# Patient Record
Sex: Male | Born: 1975 | Race: Black or African American | Hispanic: No | Marital: Married | Smoking: Current every day smoker
Health system: Southern US, Community
[De-identification: ages and names within clinical notes are randomized; demographics above are authoritative.]

## PROBLEM LIST (undated history)

## (undated) DIAGNOSIS — K219 Gastro-esophageal reflux disease without esophagitis: Secondary | ICD-10-CM

## (undated) DIAGNOSIS — J45909 Unspecified asthma, uncomplicated: Secondary | ICD-10-CM

## (undated) HISTORY — PX: NO PAST SURGERIES: SHX2092

---

## 2002-04-12 ENCOUNTER — Encounter: Payer: Self-pay | Admitting: *Deleted

## 2002-04-12 ENCOUNTER — Emergency Department (HOSPITAL_COMMUNITY): Admission: EM | Admit: 2002-04-12 | Discharge: 2002-04-12 | Payer: Self-pay | Admitting: *Deleted

## 2002-10-23 ENCOUNTER — Ambulatory Visit (HOSPITAL_COMMUNITY): Admission: RE | Admit: 2002-10-23 | Discharge: 2002-10-23 | Payer: Self-pay | Admitting: Preventative Medicine

## 2002-10-23 ENCOUNTER — Encounter: Payer: Self-pay | Admitting: Preventative Medicine

## 2003-08-13 ENCOUNTER — Emergency Department (HOSPITAL_COMMUNITY): Admission: EM | Admit: 2003-08-13 | Discharge: 2003-08-13 | Payer: Self-pay | Admitting: Emergency Medicine

## 2003-12-30 ENCOUNTER — Emergency Department (HOSPITAL_COMMUNITY): Admission: EM | Admit: 2003-12-30 | Discharge: 2003-12-31 | Payer: Self-pay | Admitting: Emergency Medicine

## 2003-12-31 ENCOUNTER — Emergency Department (HOSPITAL_COMMUNITY): Admission: EM | Admit: 2003-12-31 | Discharge: 2003-12-31 | Payer: Self-pay | Admitting: Emergency Medicine

## 2004-10-08 ENCOUNTER — Emergency Department (HOSPITAL_COMMUNITY): Admission: EM | Admit: 2004-10-08 | Discharge: 2004-10-08 | Payer: Self-pay | Admitting: Emergency Medicine

## 2005-12-05 ENCOUNTER — Emergency Department (HOSPITAL_COMMUNITY): Admission: EM | Admit: 2005-12-05 | Discharge: 2005-12-05 | Payer: Self-pay | Admitting: Emergency Medicine

## 2006-09-28 ENCOUNTER — Emergency Department (HOSPITAL_COMMUNITY): Admission: EM | Admit: 2006-09-28 | Discharge: 2006-09-28 | Payer: Self-pay | Admitting: Emergency Medicine

## 2008-04-15 ENCOUNTER — Emergency Department (HOSPITAL_COMMUNITY): Admission: EM | Admit: 2008-04-15 | Discharge: 2008-04-15 | Payer: Self-pay | Admitting: Emergency Medicine

## 2008-09-02 IMAGING — CR DG CHEST 2V
2 series · 2 of 2 positions shown · non-contrast
Comparison: None.

CLINICAL DATA: Back pain, right-sided chest pain. 
 CHEST - 2 VIEW:

[w chest pa]
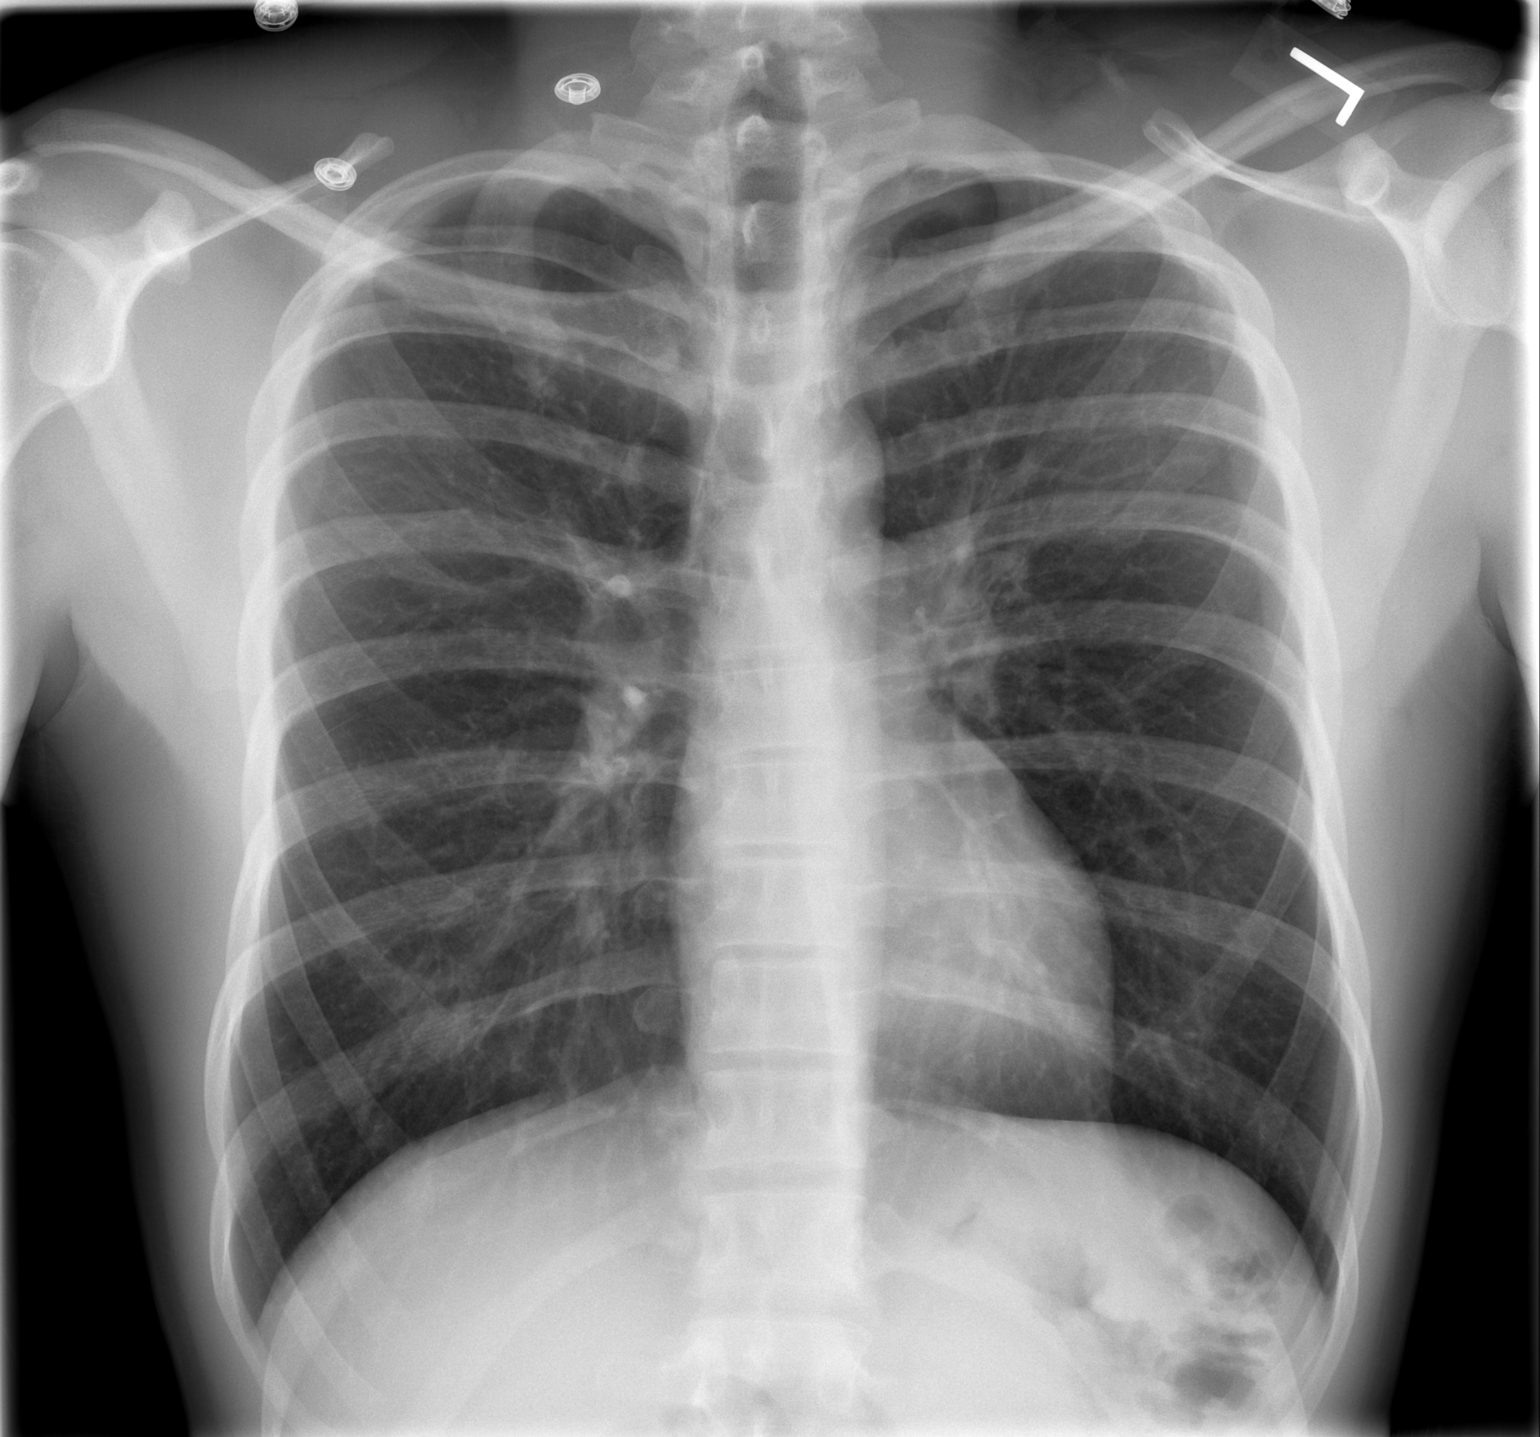

[w chest lat]
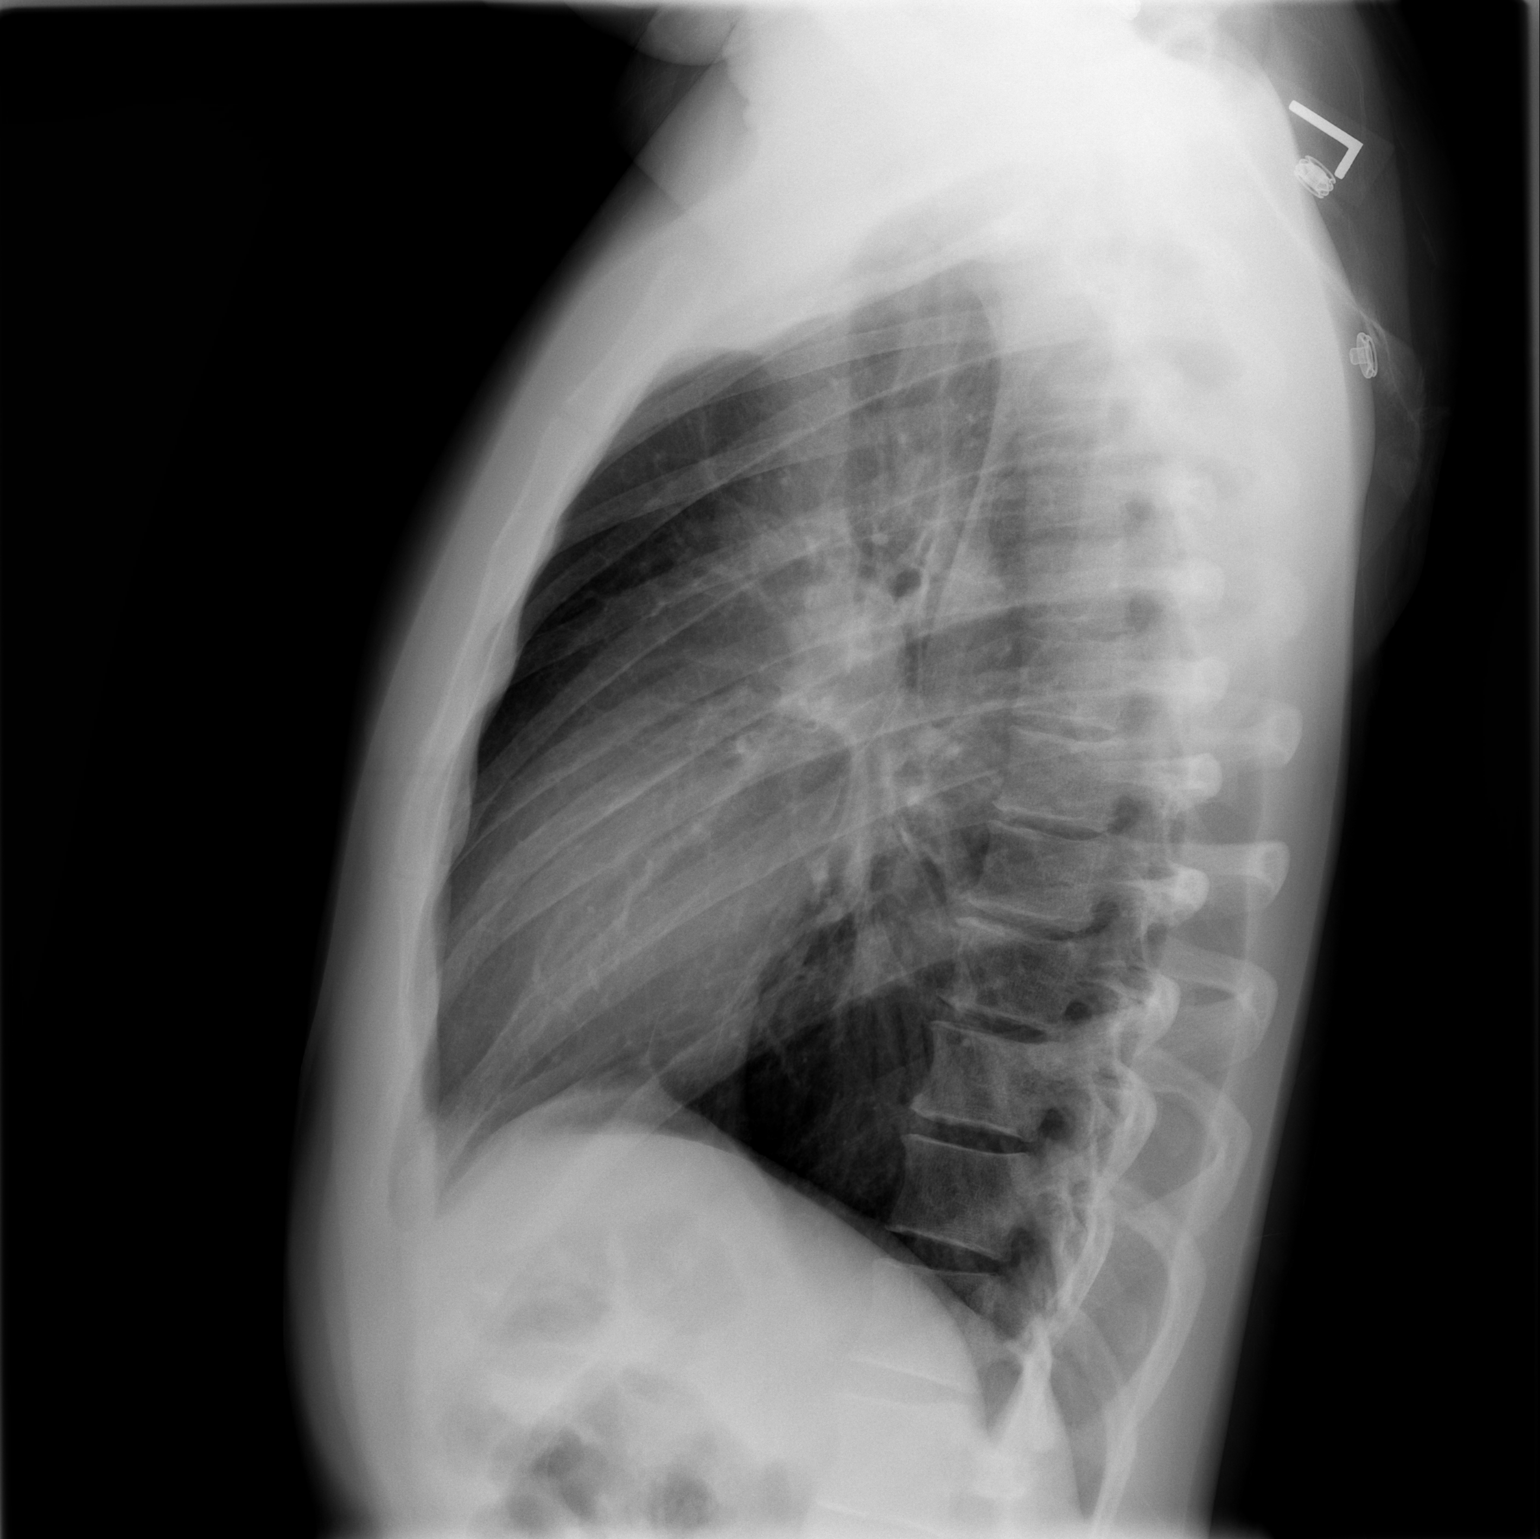

[2 of 2 positions shown; findings below may reference images not displayed]

FINDINGS: Trachea is midline.  Heart size normal.  Lungs are clear.  No pleural fluid.
IMPRESSION: No acute findings.

## 2010-07-27 LAB — RPR: RPR Ser Ql: NONREACTIVE

## 2021-08-05 ENCOUNTER — Emergency Department (HOSPITAL_BASED_OUTPATIENT_CLINIC_OR_DEPARTMENT_OTHER): Payer: 59

## 2021-08-05 ENCOUNTER — Encounter (HOSPITAL_COMMUNITY): Payer: Self-pay

## 2021-08-05 ENCOUNTER — Emergency Department (HOSPITAL_COMMUNITY)
Admission: EM | Admit: 2021-08-05 | Discharge: 2021-08-05 | Disposition: A | Discharge status: Home / Self Care | Payer: 59 | Attending: Emergency Medicine | Admitting: Emergency Medicine

## 2021-08-05 ENCOUNTER — Other Ambulatory Visit: Payer: Self-pay

## 2021-08-05 DIAGNOSIS — M79605 Pain in left leg: Secondary | ICD-10-CM | POA: Diagnosis present

## 2021-08-05 DIAGNOSIS — R0602 Shortness of breath: Secondary | ICD-10-CM | POA: Diagnosis not present

## 2021-08-05 LAB — D-DIMER, QUANTITATIVE: D-Dimer, Quant: <0.27 ug/mL-FEU (ref 0.00–0.50)

## 2021-08-05 MED ORDER — NAPROXEN 500 MG PO TABS: 500.0000 mg | ORAL_TABLET | Freq: Two times a day (BID) | ORAL | 0 refills | Status: DC | PRN

## 2021-08-05 NOTE — ED Triage Notes (Signed)
Patient complains of left leg pain that started a couple days ago with mild swelling.  ?

## 2021-08-05 NOTE — ED Notes (Signed)
Discharge instructions reviewed with patient. Patient verbalized understanding of instructions. Follow-up care and medications were reviewed. Patient ambulatory with steady gait. VSS upon discharge.  ?

## 2021-08-05 NOTE — Discharge Instructions (Signed)
Your testing is negative for blood clot.  Take the anti-inflammatories as prescribed and follow-up with your doctor.  Return to the ED with new or worsening symptoms ?

## 2021-08-05 NOTE — ED Provider Triage Note (Signed)
Emergency Medicine Provider Triage Evaluation Note ? ?Ryan Ruiz , a 46 y.o. male  was evaluated in triage.  Pt complains of pain and swelling to popliteal area.  Symptoms started last Thursday.  It has been intermittent since then.  Patient endorses swelling to popliteal area.  Denies any recent falls or injuries.  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 ? ?Denies any numbness, weakness, color change, pallor, wound, fever, chills. ? ?Review of Systems  ?Positive: Swelling and pain to palp to the area ?Negative: See above ? ?Physical Exam  ?BP (!) 144/95 (BP Location: Right Arm)   Pulse 90   Temp 99.2 ?F (37.3 ?C) (Oral)   Resp 16   SpO2 99%  ?Gen:   Awake, no distress   ?Resp:  Normal effort  ?MSK:   Moves extremities without difficulty  ?Other:  Minimal swelling to left popliteal fossa.  No swelling or tenderness to bilateral right lower extremities.  +2 left DP pulse.  Sensation intact to all digits of left foot. ? ?Medical Decision Making  ?Medically screening exam initiated at 2:32 PM.  Appropriate orders placed.  JAEKWON MCCLUNE was informed that the remainder of the evaluation will be completed by another provider, this initial triage assessment does not replace that evaluation, and the importance of remaining in the ED until their evaluation is complete. ? ?Concern for DVT versus Baker's cyst.  Will obtain ultrasound imaging for further evaluation. ?  ?Haskel Schroeder, PA-C ?08/05/21 1434 ? ?

## 2021-08-05 NOTE — ED Provider Notes (Signed)
?[facility] ?Provider Note ? ? ?CSN: [id number] ?Arrival date & time: 08/05/21  1402 ? ?  ? ?History ? ?Chief Complaint  ?Patient presents with  ? Leg Pain  ? ? ?Ryan Ruiz is a 46 y.o. male. ? ?Patient complains of left lower leg pain for the past 6 days.  Denies any specific injury the pain comes and goes improving with ibuprofen.  He climbs up and down a ladder but denies any fall or specific injury.  No cuts or scrapes.  No history of DVT.  Denies any other muscle pain or injury.  Does have some increased shortness of breath which he attributes to the weather change.  No chest pain.  No cough or fever. ? ?The history is provided by the patient.  ?Leg Pain ?Associated symptoms: no fever   ? ?  ? ?Home Medications ?Prior to Admission medications   ?Not on File  ?   ? ?Allergies    ?Patient has no known allergies.   ? ?Review of Systems   ?Review of Systems  ?Constitutional:  Negative for activity change, appetite change and fever.  ?HENT:  Negative for congestion.   ?Respiratory:  Negative for chest tightness and shortness of breath.   ?Cardiovascular:  Negative for chest pain.  ?Gastrointestinal:  Negative for abdominal pain, nausea and vomiting.  ?Genitourinary:  Negative for dysuria.  ?Musculoskeletal:  Positive for arthralgias and myalgias.  ?Skin:  Negative for rash.  ?Neurological:  Negative for seizures, weakness and numbness.  ? all other systems are negative except as noted in the HPI and PMH.  ? ?Physical Exam ?Updated Vital Signs ?BP (!) 146/90   Pulse 88   Temp 99.2 ?F (37.3 ?C) (Oral)   Resp 16   Ht 5\' 11"  (1.803 m)   Wt 78.5 kg   SpO2 99%   BMI 24.13 kg/m?  ?Physical Exam ?Vitals and nursing note reviewed.  ?Constitutional:   ?   General: He is not in acute distress. ?   Appearance: He is well-developed.  ?HENT:  ?   Head: Normocephalic and atraumatic.  ?   Mouth/Throat:  ?   Pharynx: No oropharyngeal exudate.  ?Eyes:  ?   Conjunctiva/sclera:  Conjunctivae normal.  ?   Pupils: Pupils are equal, round, and reactive to light.  ?Neck:  ?   Comments: No meningismus. ?Cardiovascular:  ?   Rate and Rhythm: Normal rate and regular rhythm.  ?   Heart sounds: Normal heart sounds. No murmur heard. ?Pulmonary:  ?   Effort: Pulmonary effort is normal. No respiratory distress.  ?   Breath sounds: Normal breath sounds.  ?Abdominal:  ?   Palpations: Abdomen is soft.  ?   Tenderness: There is no abdominal tenderness. There is no guarding or rebound.  ?Musculoskeletal:     ?   General: Tenderness present. Normal range of motion.  ?   Cervical back: Normal range of motion and neck supple.  ?   Comments: Overlying skin is normal.  There is minimal tenderness to palpation of the left superior calf.  No overlying erythema, cords streaks or redness.  Compartments soft. ? ?Intact DP and PT pulses  ?Skin: ?   General: Skin is warm.  ?Neurological:  ?   Mental Status: He is alert and oriented to person, place, and time.  ?   Cranial Nerves: No cranial nerve deficit.  ?   Motor: No abnormal muscle tone.  ?   Coordination:  Coordination normal.  ?   Comments:  5/5 strength throughout. CN 2-12 intact.Equal grip strength.   ?Psychiatric:     ?   Behavior: Behavior normal.  ? ? ?ED Results / Procedures / Treatments   ?Labs ?(all labs ordered are listed, but only abnormal results are displayed) ?Labs Reviewed  ?D-DIMER, QUANTITATIVE  ?BASIC METABOLIC PANEL  ? ? ?EKG ?None ? ?Radiology ?VAS [place] LOWER EXTREMITY VENOUS (DVT) (ONLY [facility] & [facility]) ? ?Result Date: 08/05/2021 ? Lower Venous DVT Study [name]  Date of Exam:   08/05/2021 Medical Rec #: [id number]           Accession #:    [id number] Date of Birth: [birth year]           Patient Gender: M Patient Age:   48 years Exam Location:  [facility] Procedure:      VAS [place] LOWER EXTREMITY VENOUS (DVT) Referring Phys: Debbe Mounts --------------------------------------------------------------------------------   Indications: Pain.  Risk Factors: None identified. Comparison Study: No prior studies. Performing Technologist: Oliver Hum RVT  Examination Guidelines: A complete evaluation includes B-mode imaging, spectral Doppler, color Doppler, and power Doppler as needed of all accessible portions of each vessel. Bilateral testing is considered an integral part of a complete examination. Limited examinations for reoccurring indications may be performed as noted. The reflux portion of the exam is performed with the patient in reverse Trendelenburg.  +-----+---------------+---------+-----------+----------+--------------+ RIGHTCompressibilityPhasicitySpontaneityPropertiesThrombus Aging +-----+---------------+---------+-----------+----------+--------------+ CFV  Full           Yes      Yes                                 +-----+---------------+---------+-----------+----------+--------------+   +---------+---------------+---------+-----------+----------+--------------+ LEFT     CompressibilityPhasicitySpontaneityPropertiesThrombus Aging +---------+---------------+---------+-----------+----------+--------------+ CFV      Full           Yes      Yes                                 +---------+---------------+---------+-----------+----------+--------------+ SFJ      Full                                                        +---------+---------------+---------+-----------+----------+--------------+ FV Prox  Full                                                        +---------+---------------+---------+-----------+----------+--------------+ FV Mid   Full                                                        +---------+---------------+---------+-----------+----------+--------------+ FV DistalFull                                                        +---------+---------------+---------+-----------+----------+--------------+  PFV      Full                                                         +---------+---------------+---------+-----------+----------+--------------+ POP      Full           Yes      Yes                                 +---------+---------------+---------+-----------+----------+--------------+ PTV      Full                                                        +---------+---------------+---------+-----------+----------+--------------+ PERO     Full                                                        +---------+---------------+---------+-----------+----------+--------------+     Summary: RIGHT: - No evidence of common femoral vein obstruction.  LEFT: - There is no evidence of deep vein thrombosis in the lower extremity.  - No cystic structure found in the popliteal fossa.  *See table(s) above for measurements and observations. Electronically signed by Harold Barban MD on 08/05/2021 at 8:08:49 PM.    Final    ? ?Procedures ?Procedures  ? ? ?Medications Ordered in ED ?Medications - No data to display ? ?ED Course/ Medical Decision Making/ A&P ?  ?                        ?Medical Decision Making ?Amount and/or Complexity of Data Reviewed ?Labs: ordered. ? ?Risk ?Prescription drug management. ? ?6 days of atraumatic leg pain.  Neurovascular intact. ? ?Ultrasound obtained in triage is negative for DVT or other acute pathology.  No evidence of acute limb ischemia. ?Given patient's reported shortness of breath will obtain screening D-dimer. ? ?Initial basic metabolic panel was not drawn with D-dimer.  Patient declines to wait.  Low suspicion for significant electrolyte abnormality. ?D-dimer is negative.  Low suspicion for DVT or pulmonary embolism.  Increase hydration at home.  Alternate Tylenol and ibuprofen as needed for aches.  Follow-up with PCP.  Return precautions are discussed ? ? ? ? ? ? ? ?Final Clinical Impression(s) / ED Diagnoses ?Final diagnoses:  ?Left leg pain  ? ? ?Rx / DC Orders ?ED Discharge Orders   ? ? None  ? ?  ? ? ?  ?Ezequiel Essex, MD ?08/05/21 2205 ? ?

## 2021-08-05 NOTE — Progress Notes (Signed)
Left lower extremity venous duplex has been completed. ?Preliminary results can be found in CV Proc through chart review.  ?Results were given to [facility]. ? ?08/05/21 3:49 PM ?Olen Cordial RVT   ?

## 2022-12-07 ENCOUNTER — Encounter (INDEPENDENT_AMBULATORY_CARE_PROVIDER_SITE_OTHER): Payer: Self-pay | Admitting: *Deleted

## 2023-01-31 ENCOUNTER — Encounter (INDEPENDENT_AMBULATORY_CARE_PROVIDER_SITE_OTHER): Payer: Self-pay | Admitting: *Deleted

## 2023-02-03 ENCOUNTER — Ambulatory Visit (INDEPENDENT_AMBULATORY_CARE_PROVIDER_SITE_OTHER): Payer: 59 | Admitting: Gastroenterology

## 2023-02-03 ENCOUNTER — Encounter (INDEPENDENT_AMBULATORY_CARE_PROVIDER_SITE_OTHER): Payer: Self-pay | Admitting: Gastroenterology

## 2023-02-03 VITALS — BP 125/91 | HR 66 | Temp 97.5°F | Ht 71.0 in | Wt 168.1 lb

## 2023-02-03 DIAGNOSIS — K219 Gastro-esophageal reflux disease without esophagitis: Secondary | ICD-10-CM

## 2023-02-03 DIAGNOSIS — R1319 Other dysphagia: Secondary | ICD-10-CM

## 2023-02-03 DIAGNOSIS — R1310 Dysphagia, unspecified: Secondary | ICD-10-CM

## 2023-02-03 NOTE — Progress Notes (Signed)
Katrinka Blazing, M.D. Gastroenterology & Hepatology [facility] Gastroenter[facility] [address] [place], [place] [address] Primary Care Physician: Samuella Bruin [address] [place] [address]  Referring MD: PCP  Chief Complaint: Dysphagia  History of Present Illness: MELVIN ALM is a 47 y.o. male with no PMH, who presents for evaluation of dysphagia.  Patient reports that in April 2024 he presented an episode of dysphagia to solid food. This has recurred a total of 2 more times.  States that when he ate chicken or steak, he felt the food got stuck in the middle of the chest. One time, he felt the food was not going down despite swallowing a lot of water, but it eventually passed to his stomach. He  states he has not vomited the food when it "got stuck in his chest". States he is currently undergoing dental work for dental disease and had some teeth removed. Believes his symptoms may be related to eating too fast.  Reports he has had a mild episode of heartburn every week. He does not take any prescription medications, but only Rolaids or Tums.  He is trying to avoid eating just before going to sleep.  The patient denies having any nausea, vomiting, fever, chills, hematochezia, melena, hematemesis, abdominal distention, abdominal pain, diarrhea, jaundice, pruritus or weight loss.  Last UJW:JXBJY Last Colonoscopy:never  FHx: neg for any gastrointestinal/liver disease, no malignancies Social: smokes 1pack every 2-3 days, neg alcohol. Smokes 1 marijuana joint every 2-3 days Surgical: no abdominal surgeries  Past Medical History:History reviewed. No pertinent past medical history.  Past Surgical History:History reviewed. No pertinent surgical history.  Family History:History reviewed. No pertinent family history.  Social History: Social History   Tobacco Use  Smoking Status Every Day   Types: Cigarettes  Smokeless Tobacco Never    Social History   Substance and Sexual Activity  Alcohol Use Never   Social History   Substance and Sexual Activity  Drug Use Yes   Types: Marijuana    Allergies: No Known Allergies  Medications: No current outpatient medications on file.   No current facility-administered medications for this visit.    Review of Systems: GENERAL: negative for malaise, night sweats HEENT: No changes in hearing or vision, no nose bleeds or other nasal problems. NECK: Negative for lumps, goiter, pain and significant neck swelling RESPIRATORY: Negative for cough, wheezing CARDIOVASCULAR: Negative for chest pain, leg swelling, palpitations, orthopnea GI: SEE HPI MUSCULOSKELETAL: Negative for joint pain or swelling, back pain, and muscle pain. SKIN: Negative for lesions, rash PSYCH: Negative for sleep disturbance, mood disorder and recent psychosocial stressors. HEMATOLOGY Negative for prolonged bleeding, bruising easily, and swollen nodes. ENDOCRINE: Negative for cold or heat intolerance, polyuria, polydipsia and goiter. NEURO: negative for tremor, gait imbalance, syncope and seizures. The remainder of the review of systems is noncontributory.   Physical Exam: BP (!) 125/91 (BP Location: Left Arm, Patient Position: Sitting, Cuff Size: Large)   Pulse 66   Temp (!) 97.5 F (36.4 C) (Temporal)   Ht 5\' 11"  (1.803 m)   Wt 168 lb 1.6 oz (76.2 kg)   BMI 23.45 kg/m  GENERAL: The patient is AO x3, in no acute distress. HEENT: Head is normocephalic and atraumatic. EOMI are intact. Mouth is well hydrated and without lesions. NECK: Supple. No masses LUNGS: Clear to auscultation. No presence of rhonchi/wheezing/rales. Adequate chest expansion HEART: RRR, normal s1 and s2. ABDOMEN: Soft, nontender, no guarding, no peritoneal signs, and  nondistended. BS +. No masses. EXTREMITIES: Without any cyanosis, clubbing, rash, lesions or edema. NEUROLOGIC: AOx3, no focal motor deficit. SKIN: no jaundice,  no rashes   Imaging/Labs: as above  I personally reviewed and interpreted the available labs, imaging and endoscopic files.  Impression and Plan: ARTAN VOYTKO is a 47 y.o. male with no PMH, who presents for evaluation of dysphagia.  Patient has presented a few isolated episode of dysphagia without other associated red flag signs.  He endorses having some intermittent heartburn which raises the concern for concomitant GERD.  Dysphagia could be related to strictures due to uncontrolled GERD, other possibilities include esophagitis or malignancy.  We will need to evaluate this further with an EGD.  He will be started on omeprazole 20 g every day to control his GERD symptoms.  He is due for colorectal cancer screening, we will schedule him for a colonoscopy on the same day of his esophagogastroduodenospy.  -Schedule EGD and colonoscopy -Start omeprazole 20 mg qday  All questions were answered.      Katrinka Blazing, MD Gastroenterology and Hepatology [facility] Gastroenterology

## 2023-02-03 NOTE — Patient Instructions (Signed)
Schedule EGD and colonoscopy Start omeprazole 20 mg qday

## 2023-02-04 ENCOUNTER — Telehealth (INDEPENDENT_AMBULATORY_CARE_PROVIDER_SITE_OTHER): Payer: Self-pay | Admitting: *Deleted

## 2023-02-04 ENCOUNTER — Other Ambulatory Visit (INDEPENDENT_AMBULATORY_CARE_PROVIDER_SITE_OTHER): Payer: Self-pay | Admitting: Gastroenterology

## 2023-02-04 DIAGNOSIS — R1319 Other dysphagia: Secondary | ICD-10-CM

## 2023-02-04 MED ORDER — OMEPRAZOLE 20 MG PO CPDR: 20.0000 mg | DELAYED_RELEASE_CAPSULE | Freq: Every day | ORAL | 3 refills | Status: DC

## 2023-02-04 NOTE — Telephone Encounter (Signed)
Pt seen yesterday and called because rx was not sent in to [facility]. I let him know it may be because omeprazole 20mg  is otc but he wanted to see if he could get as an rx and see if it is cheaper that way.   [facility]   [phone]

## 2023-02-04 NOTE — Telephone Encounter (Signed)
Medication sent to pharmacy  

## 2023-02-07 MED ORDER — PEG 3350-KCL-NA BICARB-NACL 420 G PO SOLR: 4000.0000 mL | Freq: Once | ORAL | 0 refills | Status: AC

## 2023-02-07 NOTE — Telephone Encounter (Signed)
Pt notified on vm that rx he was needing was sent to [facility] and to call back if any questions.

## 2023-02-07 NOTE — Addendum Note (Signed)
Addended by: Marlowe Shores on: 02/07/2023 12:16 PM   Modules accepted: Orders

## 2023-02-17 ENCOUNTER — Telehealth (INDEPENDENT_AMBULATORY_CARE_PROVIDER_SITE_OTHER): Payer: Self-pay | Admitting: *Deleted

## 2023-02-17 NOTE — Telephone Encounter (Signed)
Patient called in to R/S procedure for 11/20 d/t work. He has been moved to 11/27 at 10am. Aware will send new instructions via mychart and I have sent message to endo making aware.   Also checked [facility] website for PA: Notification/Precertification Requirement This member's plan does not currently require notification or prior-authorization through the [employer] Notification or Prior-Authorization Program. Please contact a Customer Care Professional at [phone] if you believe the information returned to be in error.

## 2023-03-09 ENCOUNTER — Encounter (INDEPENDENT_AMBULATORY_CARE_PROVIDER_SITE_OTHER): Payer: Self-pay | Admitting: *Deleted

## 2023-03-09 ENCOUNTER — Ambulatory Visit (HOSPITAL_COMMUNITY)
Admission: RE | Admit: 2023-03-09 | Discharge: 2023-03-09 | Disposition: A | Discharge status: Home / Self Care | Payer: 59 | Attending: Gastroenterology | Admitting: Gastroenterology

## 2023-03-09 ENCOUNTER — Other Ambulatory Visit: Payer: Self-pay

## 2023-03-09 ENCOUNTER — Ambulatory Visit (HOSPITAL_COMMUNITY): Payer: 59 | Admitting: Certified Registered"

## 2023-03-09 ENCOUNTER — Encounter (HOSPITAL_COMMUNITY): Payer: Self-pay | Admitting: Gastroenterology

## 2023-03-09 ENCOUNTER — Encounter (HOSPITAL_COMMUNITY)
Admission: RE | Disposition: A | Discharge status: Home / Self Care | Payer: Self-pay | Source: Home / Self Care | Attending: Gastroenterology

## 2023-03-09 DIAGNOSIS — K222 Esophageal obstruction: Secondary | ICD-10-CM | POA: Insufficient documentation

## 2023-03-09 DIAGNOSIS — J45909 Unspecified asthma, uncomplicated: Secondary | ICD-10-CM | POA: Insufficient documentation

## 2023-03-09 DIAGNOSIS — K219 Gastro-esophageal reflux disease without esophagitis: Secondary | ICD-10-CM | POA: Insufficient documentation

## 2023-03-09 DIAGNOSIS — Z79899 Other long term (current) drug therapy: Secondary | ICD-10-CM | POA: Insufficient documentation

## 2023-03-09 DIAGNOSIS — R1319 Other dysphagia: Secondary | ICD-10-CM

## 2023-03-09 DIAGNOSIS — K5730 Diverticulosis of large intestine without perforation or abscess without bleeding: Secondary | ICD-10-CM | POA: Diagnosis not present

## 2023-03-09 DIAGNOSIS — Z1211 Encounter for screening for malignant neoplasm of colon: Secondary | ICD-10-CM | POA: Diagnosis present

## 2023-03-09 DIAGNOSIS — F17210 Nicotine dependence, cigarettes, uncomplicated: Secondary | ICD-10-CM | POA: Insufficient documentation

## 2023-03-09 DIAGNOSIS — R1310 Dysphagia, unspecified: Secondary | ICD-10-CM | POA: Diagnosis not present

## 2023-03-09 DIAGNOSIS — K648 Other hemorrhoids: Secondary | ICD-10-CM | POA: Insufficient documentation

## 2023-03-09 HISTORY — PX: ESOPHAGOGASTRODUODENOSCOPY (EGD) WITH PROPOFOL: SHX5813

## 2023-03-09 HISTORY — DX: Gastro-esophageal reflux disease without esophagitis: K21.9

## 2023-03-09 HISTORY — PX: BALLOON DILATION: SHX5330

## 2023-03-09 HISTORY — DX: Unspecified asthma, uncomplicated: J45.909

## 2023-03-09 HISTORY — PX: BIOPSY: SHX5522

## 2023-03-09 HISTORY — PX: COLONOSCOPY WITH PROPOFOL: SHX5780

## 2023-03-09 LAB — HM COLONOSCOPY

## 2023-03-09 SURGERY — ESOPHAGOGASTRODUODENOSCOPY (EGD) WITH PROPOFOL
Anesthesia: General

## 2023-03-09 MED ORDER — LIDOCAINE HCL (PF) 2 % IJ SOLN
INTRAMUSCULAR | Status: AC
  Filled 2023-03-09: qty 5

## 2023-03-09 MED ORDER — PROPOFOL 500 MG/50ML IV EMUL
INTRAVENOUS | Status: DC | PRN
  Administered 2023-03-09: 150 ug/kg/min via INTRAVENOUS

## 2023-03-09 MED ORDER — DEXMEDETOMIDINE HCL IN NACL 80 MCG/20ML IV SOLN
INTRAVENOUS | Status: AC
  Filled 2023-03-09: qty 20

## 2023-03-09 MED ORDER — PROPOFOL 1000 MG/100ML IV EMUL
INTRAVENOUS | Status: AC
  Filled 2023-03-09: qty 100

## 2023-03-09 MED ORDER — DEXMEDETOMIDINE HCL IN NACL 80 MCG/20ML IV SOLN
INTRAVENOUS | Status: DC | PRN
  Administered 2023-03-09: 4 ug via INTRAVENOUS
  Administered 2023-03-09: 12 ug via INTRAVENOUS

## 2023-03-09 MED ORDER — PROPOFOL 10 MG/ML IV BOLUS
INTRAVENOUS | Status: DC | PRN
  Administered 2023-03-09: 100 mg via INTRAVENOUS
  Administered 2023-03-09: 40 mg via INTRAVENOUS
  Administered 2023-03-09: 50 mg via INTRAVENOUS

## 2023-03-09 MED ORDER — STERILE WATER FOR IRRIGATION IR SOLN
Status: DC | PRN
  Administered 2023-03-09: 120 mL

## 2023-03-09 MED ORDER — LACTATED RINGERS IV SOLN: INTRAVENOUS | Status: DC | PRN

## 2023-03-09 MED ORDER — OMEPRAZOLE 20 MG PO CPDR: 20.0000 mg | DELAYED_RELEASE_CAPSULE | Freq: Every day | ORAL | 3 refills | Status: AC

## 2023-03-09 MED ORDER — LIDOCAINE HCL (CARDIAC) PF 100 MG/5ML IV SOSY
PREFILLED_SYRINGE | INTRAVENOUS | Status: DC | PRN
  Administered 2023-03-09: 100 mg via INTRAVENOUS

## 2023-03-09 NOTE — H&P (Signed)
Ryan Ruiz is an 47 y.o. male.   Chief Complaint: dysphagia and CRC screening.  HPI: Ryan Ruiz is a 47 y.o. male with no PMH, who presents for evaluation of dysphagia and CRC screening.   Patient reports that he has presented recurrent dysphagia.  No nausea or vomiting.  No fever, chills, melena or hematochezia.  Past Medical History:  Diagnosis Date   Asthma    GERD (gastroesophageal reflux disease)     Past Surgical History:  Procedure Laterality Date   NO PAST SURGERIES      History reviewed. No pertinent family history. Social History:  reports that he has been smoking cigarettes. He has never used smokeless tobacco. He reports current drug use. Frequency: 3.00 times per week. Drug: Marijuana. He reports that he does not drink alcohol.  Allergies: No Known Allergies  Medications Prior to Admission  Medication Sig Dispense Refill   omeprazole (PRILOSEC) 20 MG capsule Take 1 capsule (20 mg total) by mouth daily. 90 capsule 3    No results found for this or any previous visit (from the past 48 hour(s)). No results found.  Review of Systems  HENT:  Positive for trouble swallowing.   All other systems reviewed and are negative.   Blood pressure (!) 157/108, pulse 60, temperature 97.7 F (36.5 C), temperature source Oral, resp. rate 12, height 5\' 11"  (1.803 m), weight 76.2 kg, SpO2 98%. Physical Exam  GENERAL: The patient is AO x3, in no acute distress. HEENT: Head is normocephalic and atraumatic. EOMI are intact. Mouth is well hydrated and without lesions. NECK: Supple. No masses LUNGS: Clear to auscultation. No presence of rhonchi/wheezing/rales. Adequate chest expansion HEART: RRR, normal s1 and s2. ABDOMEN: Soft, nontender, no guarding, no peritoneal signs, and nondistended. BS +. No masses. EXTREMITIES: Without any cyanosis, clubbing, rash, lesions or edema. NEUROLOGIC: AOx3, no focal motor deficit. SKIN: no jaundice, no  rashes  Assessment/Plan Ryan Ruiz is a 47 y.o. male with no PMH, who presents for evaluation of dysphagia and CRC screening.  Will proceed with EGD and colonoscopy.  Dolores Frame, MD 03/09/2023, 9:50 AM

## 2023-03-09 NOTE — Op Note (Signed)
[facility] Patient Name: Ryan Ruiz Procedure Date: 03/09/2023 9:55 AM MRN: [id number] Date of Birth: Oct 26, 1975 Attending MD: Katrinka Blazing , , [id number] CSN: [id number] Age: 47 Admit Type: Outpatient Procedure:                Upper GI endoscopy Indications:              Dysphagia Providers:                Katrinka Blazing, Crystal Page, Kristine L. Jessee Avers, Technician Referring MD:              Medicines:                Monitored Anesthesia Care Complications:            No immediate complications. Estimated Blood Loss:     Estimated blood loss: none. Procedure:                Pre-Anesthesia Assessment:                           - Prior to the procedure, a History and Physical                            was performed, and patient medications, allergies                            and sensitivities were reviewed. The patient's                            tolerance of previous anesthesia was reviewed.                           - The risks and benefits of the procedure and the                            sedation options and risks were discussed with the                            patient. All questions were answered and informed                            consent was obtained.                           - ASA Grade Assessment: II - A patient with mild                            systemic disease.                           After obtaining informed consent, the endoscope was                            passed under direct vision. Throughout the  procedure, the patient's blood pressure, pulse, and                            oxygen saturations were monitored continuously. The                            GIF-H190 ([id number]) scope was introduced through the                            mouth, and advanced to the second part of duodenum.                            The upper GI endoscopy was accomplished without                             difficulty. The patient tolerated the procedure                            well. Scope In: 10:07:55 AM Scope Out: 10:18:37 AM Total Procedure Duration: 0 hours 10 minutes 42 seconds  Findings:      A low-grade of narrowing Schatzki ring was found at the gastroesophageal       junction. Biopsies were taken with a cold forceps for histology. A TTS       dilator was passed through the scope. Dilation with a 15-16.5-18 mm       balloon dilator was performed to 18 mm. The dilation site was examined       and showed mild mucosal disruption and mild improvement in luminal       narrowing.      The gastroesophageal flap valve was visualized endoscopically and       classified as Hill Grade II (fold present, opens with respiration).      The stomach was normal.      The examined duodenum was normal. Impression:               - Low-grade of narrowing Schatzki ring. Biopsied.                            Dilated.                           - Normal stomach.                           - Normal examined duodenum. Moderate Sedation:      Per Anesthesia Care Recommendation:           - Discharge patient to home (ambulatory).                           - Resume previous diet.                           - Await pathology results.                           - Continue present medications. Procedure Code(s):        ---  Professional ---                           [id number], Esophagogastroduodenoscopy, flexible,                            transoral; with transendoscopic balloon dilation of                            esophagus (less than 30 mm diameter)                           43239, 59, Esophagogastroduodenoscopy, flexible,                            transoral; with biopsy, single or multiple Diagnosis Code(s):        --- Professional ---                           K22.2, Esophageal obstruction                           R13.10, Dysphagia, unspecified CPT copyright 2022 American [employer] Association. All  rights reserved. The codes documented in this report are preliminary and upon coder review may  be revised to meet current compliance requirements. Katrinka Blazing, MD Katrinka Blazing,  03/09/2023 10:54:14 AM This report has been signed electronically. Number of Addenda: 0

## 2023-03-09 NOTE — Op Note (Signed)
[facility] Patient Name: Ryan Ruiz Procedure Date: 03/09/2023 10:21 AM MRN: [id number] Date of Birth: 10/27/1975 Attending MD: Katrinka Blazing , , [id number] CSN: [id number] Age: 47 Admit Type: Outpatient Procedure:                Colonoscopy Indications:              Screening for colorectal malignant neoplasm Providers:                Katrinka Blazing, Crystal Page, Kristine L. Jessee Avers, Technician Referring MD:              Medicines:                Monitored Anesthesia Care Complications:            No immediate complications. Estimated Blood Loss:     Estimated blood loss: none. Procedure:                Pre-Anesthesia Assessment:                           - Prior to the procedure, a History and Physical                            was performed, and patient medications, allergies                            and sensitivities were reviewed. The patient's                            tolerance of previous anesthesia was reviewed.                           - The risks and benefits of the procedure and the                            sedation options and risks were discussed with the                            patient. All questions were answered and informed                            consent was obtained.                           - ASA Grade Assessment: II - A patient with mild                            systemic disease.                           - ASA Grade Assessment: II - A patient with mild                            systemic disease.  After obtaining informed consent, the colonoscope                            was passed under direct vision. Throughout the                            procedure, the patient's blood pressure, pulse, and                            oxygen saturations were monitored continuously. The                            [id number]-[id number] ([id number]) scope was introduced through                             the anus and advanced to the the cecum, identified                            by appendiceal orifice and ileocecal valve. The                            colonoscopy was performed without difficulty. The                            patient tolerated the procedure well. The quality                            of the bowel preparation was good. Scope In: 10:23:46 AM Scope Out: 10:49:42 AM Scope Withdrawal Time: 0 hours 20 minutes 29 seconds  Total Procedure Duration: 0 hours 25 minutes 56 seconds  Findings:      The perianal and digital rectal examinations were normal.      Scattered small-mouthed diverticula were found in the sigmoid colon.      Non-bleeding internal hemorrhoids were found during retroflexion. The       hemorrhoids were small. Impression:               - Diverticulosis in the sigmoid colon.                           - Non-bleeding internal hemorrhoids.                           - No specimens collected. Moderate Sedation:      Per Anesthesia Care Recommendation:           - Discharge patient to home (ambulatory).                           - Resume previous diet.                           - Repeat colonoscopy in 10 years for screening                            purposes. Procedure Code(s):        --- Professional ---  Z6109, Colorectal cancer screening; colonoscopy on                            individual not meeting criteria for high risk Diagnosis Code(s):        --- Professional ---                           Z12.11, Encounter for screening for malignant                            neoplasm of colon                           K64.8, Other hemorrhoids                           K57.30, Diverticulosis of large intestine without                            perforation or abscess without bleeding CPT copyright 2022 [employer]. All rights reserved. The codes documented in this report are preliminary and upon coder review may  be  revised to meet current compliance requirements. Katrinka Blazing, MD Katrinka Blazing,  03/09/2023 10:59:27 AM This report has been signed electronically. Number of Addenda: 0

## 2023-03-09 NOTE — Discharge Instructions (Addendum)
You are being discharged to home.  Resume your previous diet.  We are waiting for your pathology results.  Continue your present medications.  Your physician has recommended a repeat colonoscopy in 10 years for screening purposes.

## 2023-03-09 NOTE — Anesthesia Preprocedure Evaluation (Signed)
Anesthesia Evaluation  Patient identified by MRN, date of birth, ID band Patient awake    Reviewed: Allergy & Precautions, H&P , NPO status , Patient's Chart, lab work & pertinent test results, reviewed documented beta blocker date and time   Airway Mallampati: II  TM Distance: >3 FB Neck ROM: full    Dental no notable dental hx. (+) Dental Advisory Given, Teeth Intact   Pulmonary asthma , Current Smoker and Patient abstained from smoking.   Pulmonary exam normal breath sounds clear to auscultation       Cardiovascular Exercise Tolerance: Good negative cardio ROS Normal cardiovascular exam Rhythm:regular Rate:Normal     Neuro/Psych negative neurological ROS  negative psych ROS   GI/Hepatic Neg liver ROS,GERD  ,,  Endo/Other  negative endocrine ROS    Renal/GU negative Renal ROS  negative genitourinary   Musculoskeletal   Abdominal Normal abdominal exam  (+)   Peds  Hematology negative hematology ROS (+)   Anesthesia Other Findings   Reproductive/Obstetrics negative OB ROS                             Anesthesia Physical Anesthesia Plan  ASA: 2  Anesthesia Plan: General   Post-op Pain Management: Minimal or no pain anticipated   Induction: Intravenous  PONV Risk Score and Plan: Propofol infusion  Airway Management Planned: Nasal Cannula and Natural Airway  Additional Equipment: None  Intra-op Plan:   Post-operative Plan:   Informed Consent: I have reviewed the patients History and Physical, chart, labs and discussed the procedure including the risks, benefits and alternatives for the proposed anesthesia with the patient or authorized representative who has indicated his/her understanding and acceptance.     Dental Advisory Given  Plan Discussed with: CRNA  Anesthesia Plan Comments:         Anesthesia Quick Evaluation

## 2023-03-09 NOTE — Anesthesia Postprocedure Evaluation (Signed)
Anesthesia Post Note  Patient: MARCHELLO ROHAL  Procedure(s) Performed: ESOPHAGOGASTRODUODENOSCOPY (EGD) WITH PROPOFOL COLONOSCOPY WITH PROPOFOL BALLOON DILATION BIOPSY  Patient location during evaluation: PACU Anesthesia Type: General Level of consciousness: awake and alert Pain management: pain level controlled Vital Signs Assessment: post-procedure vital signs reviewed and stable Respiratory status: spontaneous breathing, nonlabored ventilation, respiratory function stable and patient connected to nasal cannula oxygen Cardiovascular status: blood pressure returned to baseline and stable Postop Assessment: no apparent nausea or vomiting Anesthetic complications: no   There were no known notable events for this encounter.   Last Vitals:  Vitals:   03/09/23 1056 03/09/23 1100  BP: 102/70 118/77  Pulse: 73   Resp: (!) 22   Temp: 36.6 C   SpO2: 97%     Last Pain:  Vitals:   03/09/23 1103  TempSrc:   PainSc: 0-No pain                 Shabazz Mckey L Kareli Hossain

## 2023-03-09 NOTE — Transfer of Care (Addendum)
Immediate Anesthesia Transfer of Care Note  Patient: Ryan Ruiz  Procedure(s) Performed: ESOPHAGOGASTRODUODENOSCOPY (EGD) WITH PROPOFOL COLONOSCOPY WITH PROPOFOL BALLOON DILATION BIOPSY  Patient Location: Endoscopy Unit  Anesthesia Type:General  Level of Consciousness: drowsy and patient cooperative  Airway & Oxygen Therapy: Patient Spontanous Breathing and Patient connected to nasal cannula oxygen  Post-op Assessment: Report given to RN and Post -op Vital signs reviewed and stable  Post vital signs: Reviewed and stable  Last Vitals:  Vitals Value Taken Time  BP 102/70 03/09/23   1056  Temp 36.6 03/09/23   1056  Pulse 75 03/09/23   1056  Resp 22 03/09/23   1056  SpO2 97% 03/09/23   1056    Last Pain:  Vitals:   03/09/23 1002  TempSrc:   PainSc: 0-No pain      Patients Stated Pain Goal: 7 (03/09/23 0909)  Complications: No notable events documented.

## 2023-03-11 LAB — SURGICAL PATHOLOGY

## 2023-03-15 ENCOUNTER — Encounter (INDEPENDENT_AMBULATORY_CARE_PROVIDER_SITE_OTHER): Payer: Self-pay | Admitting: Gastroenterology

## 2023-03-15 ENCOUNTER — Encounter (INDEPENDENT_AMBULATORY_CARE_PROVIDER_SITE_OTHER): Payer: Self-pay

## 2023-03-15 ENCOUNTER — Encounter (INDEPENDENT_AMBULATORY_CARE_PROVIDER_SITE_OTHER): Payer: Self-pay | Admitting: *Deleted

## 2023-03-16 ENCOUNTER — Encounter (HOSPITAL_COMMUNITY): Payer: Self-pay | Admitting: Gastroenterology

## 2023-08-02 ENCOUNTER — Ambulatory Visit (INDEPENDENT_AMBULATORY_CARE_PROVIDER_SITE_OTHER): Payer: 59 | Admitting: Gastroenterology

## 2023-10-03 ENCOUNTER — Ambulatory Visit (INDEPENDENT_AMBULATORY_CARE_PROVIDER_SITE_OTHER): Admitting: Gastroenterology

## 2023-10-03 NOTE — Progress Notes (Incomplete)
 Referring Provider: Kristie Morene LITTIE DEVONNA Primary Care Physician:  Kristie Morene LITTIE, PA-C Primary GI Physician: Dr. Eartha   No chief complaint on file.   HPI:   Ryan Ruiz is a 48 y.o. male with no significant pmh   Patient presenting today for follow up of: Dysphagia, s/p EGD   Last seen October 2024, at that time, having dysphagia to solid foods, heartburn every week. Taking rolaids, tums.   Recommended to schedule EGD/Colonoscopy   Present:       Last Colonoscopy:02/2023- Diverticulosis in the sigmoid colon.                           - Non-bleeding internal hemorrhoids.                           - No specimens collected.  Last Endoscopy:02/2023- Low-grade of narrowing Schatzki ring. Biopsied.                            Dilated.                           - Normal stomach.                           - Normal examined duodenum. A. GE JUNCTION, BIOPSY:       Squamocolumnar mucosa without significant diagnostic alteration.       Negative for intestinal metaplasia or dysplasia.   Recommendations:  Repeat 10 years   Past Medical History:  Diagnosis Date   Asthma    GERD (gastroesophageal reflux disease)     Past Surgical History:  Procedure Laterality Date   BALLOON DILATION  03/09/2023   Procedure: BALLOON DILATION;  Surgeon: Eartha Angelia Sieving, MD;  Location: [facility];  Service: Gastroenterology;;   BIOPSY  03/09/2023   Procedure: BIOPSY;  Surgeon: Eartha Angelia Sieving, MD;  Location: [facility];  Service: Gastroenterology;;   COLONOSCOPY WITH PROPOFOL  N/A 03/09/2023   Procedure: COLONOSCOPY WITH PROPOFOL ;  Surgeon: Eartha Angelia Sieving, MD;  Location: [facility];  Service: Gastroenterology;  Laterality: N/A;  9:15AM;ASA 1   ESOPHAGOGASTRODUODENOSCOPY (EGD) WITH PROPOFOL  N/A 03/09/2023   Procedure: ESOPHAGOGASTRODUODENOSCOPY (EGD) WITH PROPOFOL ;  Surgeon: Eartha Angelia Sieving, MD;  Location: [facility];  Service:  Gastroenterology;  Laterality: N/A;  9:15AM;ASA 1   NO PAST SURGERIES      Current Outpatient Medications  Medication Sig Dispense Refill   omeprazole  (PRILOSEC) 20 MG capsule Take 1 capsule (20 mg total) by mouth daily. 90 capsule 3   No current facility-administered medications for this visit.    Allergies as of 10/03/2023   (No Known Allergies)    Social History   Socioeconomic History   Marital status: Married    Spouse name: Not on file   Number of children: Not on file   Years of education: Not on file   Highest education level: Not on file  Occupational History   Not on file  Tobacco Use   Smoking status: Every Day    Types: Cigarettes   Smokeless tobacco: Never   Tobacco comments:    Patient states he works at [employer] and smokes maybe one pack every 3 to 4 days  Vaping Use   Vaping status: Never Used  Substance and Sexual Activity   Alcohol use: Never   Drug use: Yes    Frequency: 3.0 times per week    Types: Marijuana   Sexual activity: Not on file  Other Topics Concern   Not on file  Social History Narrative   Not on file   Social Drivers of Health   Financial Resource Strain: Low Risk  (11/02/2022)   [facility]   Overall Financial Resource Strain (CARDIA)    Difficulty of Paying Living Expenses: Not hard at all  Food Insecurity: No Food Insecurity (11/02/2022)   Received from [facility]   Hunger Vital Sign    Worried About Running Out of Food in the Last Year: Never true    Ran Out of Food in the Last Year: Never true  Transportation Needs: No Transportation Needs (11/02/2022)   Received [facility] - Transportation    Lack of Transportation (Medical): No    Lack of Transportation (Non-Medical): No  Physical Activity: Not on file  Stress: Not on file  Social Connections: Unknown (10/01/2022)   Received from [facility]   Social Network    Social Network: Not on file    Review of systems General:  negative for malaise, night sweats, fever, chills, weight los Neck: Negative for lumps, goiter, pain and significant neck swelling Resp: Negative for cough, wheezing, dyspnea at rest CV: Negative for chest pain, leg swelling, palpitations, orthopnea GI: denies melena, hematochezia, nausea, vomiting, diarrhea, constipation, dysphagia, odyonophagia, early satiety or unintentional weight loss.  MSK: Negative for joint pain or swelling, back pain, and muscle pain. Derm: Negative for itching or rash Psych: Denies depression, anxiety, memory loss, confusion. No homicidal or suicidal ideation.  Heme: Negative for prolonged bleeding, bruising easily, and swollen nodes. Endocrine: Negative for cold or heat intolerance, polyuria, polydipsia and goiter. Neuro: negative for tremor, gait imbalance, syncope and seizures. The remainder of the review of systems is noncontributory.  Physical Exam: There were no vitals taken for this visit. General:   Alert and oriented. No distress noted. Pleasant and cooperative.  Head:  Normocephalic and atraumatic. Eyes:  Conjuctiva clear without scleral icterus. Mouth:  Oral mucosa pink and moist. Good dentition. No lesions. Heart: Normal rate and rhythm, s1 and s2 heart sounds present.  Lungs: Clear lung sounds in all lobes. Respirations equal and unlabored. Abdomen:  +BS, soft, non-tender and non-distended. No rebound or guarding. No HSM or masses noted. Derm: No palmar erythema or jaundice Msk:  Symmetrical without gross deformities. Normal posture. Extremities:  Without edema. Neurologic:  Alert and  oriented x4 Psych:  Alert and cooperative. Normal mood and affect.  Invalid input(s): 6 MONTHS   ASSESSMENT: Ryan Ruiz is a 48 y.o. male presenting today    PLAN:  1. 2. 3.   Follow Up: ***  Owen Pratte L. Lynnet Hefley, MSN, APRN, AGNP-C Adult-Gerontology Nurse Practitioner [facility]

## 2023-10-06 ENCOUNTER — Ambulatory Visit (INDEPENDENT_AMBULATORY_CARE_PROVIDER_SITE_OTHER): Admitting: Gastroenterology

## 2023-10-10 ENCOUNTER — Ambulatory Visit (INDEPENDENT_AMBULATORY_CARE_PROVIDER_SITE_OTHER): Admitting: Gastroenterology

## 2024-01-25 ENCOUNTER — Encounter (INDEPENDENT_AMBULATORY_CARE_PROVIDER_SITE_OTHER): Payer: Self-pay | Admitting: Gastroenterology
